# Patient Record
Sex: Female | Born: 1970 | Race: Black or African American | Hispanic: No | Marital: Single | Smoking: Never smoker
Health system: Southern US, Community
[De-identification: ages and names within clinical notes are randomized; demographics above are authoritative.]

## PROBLEM LIST (undated history)

## (undated) HISTORY — PX: ABDOMINAL HYSTERECTOMY: SHX81

---

## 2016-10-28 DIAGNOSIS — K760 Fatty (change of) liver, not elsewhere classified: Secondary | ICD-10-CM | POA: Diagnosis not present

## 2016-10-28 DIAGNOSIS — N200 Calculus of kidney: Secondary | ICD-10-CM | POA: Diagnosis not present

## 2017-01-16 DIAGNOSIS — Z1231 Encounter for screening mammogram for malignant neoplasm of breast: Secondary | ICD-10-CM | POA: Diagnosis not present

## 2017-02-25 DIAGNOSIS — Z1211 Encounter for screening for malignant neoplasm of colon: Secondary | ICD-10-CM | POA: Diagnosis not present

## 2017-02-25 DIAGNOSIS — K5730 Diverticulosis of large intestine without perforation or abscess without bleeding: Secondary | ICD-10-CM | POA: Diagnosis not present

## 2017-02-25 DIAGNOSIS — K6389 Other specified diseases of intestine: Secondary | ICD-10-CM | POA: Diagnosis not present

## 2017-09-25 ENCOUNTER — Encounter (HOSPITAL_BASED_OUTPATIENT_CLINIC_OR_DEPARTMENT_OTHER): Payer: Self-pay

## 2017-09-25 ENCOUNTER — Other Ambulatory Visit: Payer: Self-pay

## 2017-09-25 ENCOUNTER — Emergency Department (HOSPITAL_BASED_OUTPATIENT_CLINIC_OR_DEPARTMENT_OTHER)
Admission: EM | Admit: 2017-09-25 | Discharge: 2017-09-25 | Disposition: A | Discharge status: Home / Self Care | Payer: 59 | Attending: Emergency Medicine | Admitting: Emergency Medicine

## 2017-09-25 ENCOUNTER — Emergency Department (HOSPITAL_BASED_OUTPATIENT_CLINIC_OR_DEPARTMENT_OTHER): Payer: 59

## 2017-09-25 DIAGNOSIS — K429 Umbilical hernia without obstruction or gangrene: Secondary | ICD-10-CM | POA: Diagnosis not present

## 2017-09-25 DIAGNOSIS — R3121 Asymptomatic microscopic hematuria: Secondary | ICD-10-CM | POA: Diagnosis not present

## 2017-09-25 DIAGNOSIS — K760 Fatty (change of) liver, not elsewhere classified: Secondary | ICD-10-CM | POA: Insufficient documentation

## 2017-09-25 DIAGNOSIS — N200 Calculus of kidney: Secondary | ICD-10-CM | POA: Diagnosis not present

## 2017-09-25 DIAGNOSIS — R1010 Upper abdominal pain, unspecified: Secondary | ICD-10-CM | POA: Diagnosis present

## 2017-09-25 LAB — COMPREHENSIVE METABOLIC PANEL
ALK PHOS: 106 U/L (ref 38–126)
ALT: 82 U/L — ABNORMAL HIGH (ref 0–44)
ANION GAP: 11 (ref 5–15)
AST: 147 U/L — ABNORMAL HIGH (ref 15–41)
Albumin: 4 g/dL (ref 3.5–5.0)
BUN: 11 mg/dL (ref 6–20)
CALCIUM: 9.5 mg/dL (ref 8.9–10.3)
CHLORIDE: 102 mmol/L (ref 98–111)
CO2: 26 mmol/L (ref 22–32)
Creatinine, Ser: 0.82 mg/dL (ref 0.44–1.00)
Glucose, Bld: 93 mg/dL (ref 70–99)
Potassium: 3.9 mmol/L (ref 3.5–5.1)
SODIUM: 139 mmol/L (ref 135–145)
Total Bilirubin: 1 mg/dL (ref 0.3–1.2)
Total Protein: 8.5 g/dL — ABNORMAL HIGH (ref 6.5–8.1)

## 2017-09-25 LAB — CBC WITH DIFFERENTIAL/PLATELET
BASOS ABS: 0 10*3/uL (ref 0.0–0.1)
BASOS PCT: 0 %
EOS PCT: 5 %
Eosinophils Absolute: 0.4 10*3/uL (ref 0.0–0.7)
HCT: 43.9 % (ref 36.0–46.0)
Hemoglobin: 14.8 g/dL (ref 12.0–15.0)
LYMPHS PCT: 29 %
Lymphs Abs: 2.5 10*3/uL (ref 0.7–4.0)
MCH: 32.6 pg (ref 26.0–34.0)
MCHC: 33.7 g/dL (ref 30.0–36.0)
MCV: 96.7 fL (ref 78.0–100.0)
MONO ABS: 0.7 10*3/uL (ref 0.1–1.0)
Monocytes Relative: 8 %
Neutro Abs: 5 10*3/uL (ref 1.7–7.7)
Neutrophils Relative %: 58 %
PLATELETS: 278 10*3/uL (ref 150–400)
RBC: 4.54 MIL/uL (ref 3.87–5.11)
RDW: 12.6 % (ref 11.5–15.5)
WBC: 8.7 10*3/uL (ref 4.0–10.5)

## 2017-09-25 LAB — URINALYSIS, ROUTINE W REFLEX MICROSCOPIC
Bilirubin Urine: NEGATIVE
Glucose, UA: NEGATIVE mg/dL
KETONES UR: NEGATIVE mg/dL
LEUKOCYTES UA: NEGATIVE
NITRITE: NEGATIVE
PH: 7 (ref 5.0–8.0)
Protein, ur: NEGATIVE mg/dL
SPECIFIC GRAVITY, URINE: 1.01 (ref 1.005–1.030)

## 2017-09-25 LAB — URINALYSIS, MICROSCOPIC (REFLEX)

## 2017-09-25 LAB — TROPONIN I

## 2017-09-25 LAB — LIPASE, BLOOD: LIPASE: 91 U/L — AB (ref 11–51)

## 2017-09-25 MED ORDER — IOPAMIDOL (ISOVUE-300) INJECTION 61%
100.0000 mL | Freq: Once | INTRAVENOUS | Status: AC | PRN
  Administered 2017-09-25: 100 mL via INTRAVENOUS

## 2017-09-25 MED ORDER — FAMOTIDINE 20 MG PO TABS: 20.0000 mg | ORAL_TABLET | Freq: Every day | ORAL | 0 refills | Status: AC

## 2017-09-25 NOTE — ED Triage Notes (Signed)
C/o "feeling something really hard above my belly button" x 1 month-nausea x 2 months ago-NAD-steady gait

## 2017-09-25 NOTE — ED Provider Notes (Signed)
[facility] EMERGENCY DEPARTMENT Provider Note   CSN: [id number] Arrival date & time: 09/25/17  1430     History   Chief Complaint Chief Complaint  Patient presents with  . Abdominal Pain    HPI Angela Kramer is a 47 y.o. female.  She has no significant medical history other than an abdominal hysterectomy.  She is presenting with 2 months of intermittent nausea associated with some upper abdominal discomfort.  It does not seem to be related to food.  It is crampy in nature under her rib cage and subxiphoid.  It does not cause her to vomit.  Seems to happen once every week or so.  She is also noticed a small mass just above her umbilicus for about 1 month.  She is not sure if it been growing in size but is only noticed it a month ago.  No trauma no fevers no chills no chest pain no shortness of breath no vomiting no diarrhea no urinary symptoms no vaginal bleeding or discharge.  She states she is noticed a little bit of red on the toilet paper when she moves her bowels.  This is infrequent.  The history is provided by the patient.  Abdominal Pain   This is a new problem. Episode onset: 2 months. The problem occurs every several days. The problem has not changed since onset.The pain is associated with an unknown factor. The pain is located in the epigastric region. The quality of the pain is cramping. The pain is moderate. Associated symptoms include nausea. Pertinent negatives include fever, diarrhea, melena, vomiting, dysuria, frequency, hematuria and headaches. Nothing aggravates the symptoms. Nothing relieves the symptoms.    History reviewed. No pertinent past medical history.  There are no active problems to display for this patient.   Past Surgical History:  Procedure Laterality Date  . ABDOMINAL HYSTERECTOMY       OB History   None      Home Medications    Prior to Admission medications   Not on File    Family History No family history on  file.  Social History Social History   Tobacco Use  . Smoking status: Never Smoker  . Smokeless tobacco: Never Used  Substance Use Topics  . Alcohol use: Yes    Comment: daily  . Drug use: Yes    Types: Marijuana     Allergies   Percocet [oxycodone-acetaminophen]   Review of Systems Review of Systems  Constitutional: Negative for fever.  HENT: Negative for sore throat.   Eyes: Negative for visual disturbance.  Respiratory: Negative for shortness of breath.   Cardiovascular: Negative for chest pain.  Gastrointestinal: Positive for abdominal pain and nausea. Negative for diarrhea, melena and vomiting.  Genitourinary: Negative for dysuria, frequency and hematuria.  Musculoskeletal: Negative for back pain.  Skin: Negative for rash.  Neurological: Negative for headaches.     Physical Exam Updated Vital Signs BP (!) 157/107 (BP Location: Left Arm)   Pulse 78   Temp 98.1 F (36.7 C) (Oral)   Resp 16   Ht 5\' 8"  (1.727 m)   Wt 107 kg   SpO2 99%   BMI 35.87 kg/m   Physical Exam  Constitutional: She appears well-developed and well-nourished. No distress.  HENT:  Head: Normocephalic and atraumatic.  Eyes: Conjunctivae are normal.  Neck: Neck supple.  Cardiovascular: Normal rate, regular rhythm and normal heart sounds.  No murmur heard. Pulmonary/Chest: Effort normal and breath sounds normal. No respiratory distress.  Abdominal:  Soft. She exhibits mass (about 2cm mobile midline just above umbilicus). There is no tenderness. There is no rigidity, no rebound and no guarding.  Musculoskeletal: She exhibits no edema, tenderness or deformity.  Neurological: She is alert.  Skin: Skin is warm and dry. Capillary refill takes less than 2 seconds.  Psychiatric: She has a normal mood and affect.  Nursing note and vitals reviewed.    ED Treatments / Results  Labs (all labs ordered are listed, but only abnormal results are displayed) Labs Reviewed  COMPREHENSIVE METABOLIC  PANEL - Abnormal; Notable for the following components:      Result Value   Total Protein 8.5 (*)    AST 147 (*)    ALT 82 (*)    All other components within normal limits  LIPASE, BLOOD - Abnormal; Notable for the following components:   Lipase 91 (*)    All other components within normal limits  URINALYSIS, ROUTINE W REFLEX MICROSCOPIC - Abnormal; Notable for the following components:   Color, Urine AMBER (*)    APPearance CLOUDY (*)    Hgb urine dipstick LARGE (*)    All other components within normal limits  URINALYSIS, MICROSCOPIC (REFLEX) - Abnormal; Notable for the following components:   Bacteria, UA MANY (*)    All other components within normal limits  TROPONIN I  CBC WITH DIFFERENTIAL/PLATELET  HEPATITIS PANEL, ACUTE    EKG None  Radiology Ct Abdomen Pelvis W Contrast  Result Date: 09/25/2017 CLINICAL DATA:  Periumbilical pain and palpable mass. EXAM: CT ABDOMEN AND PELVIS WITH CONTRAST TECHNIQUE: Multidetector CT imaging of the abdomen and pelvis was performed using the standard protocol following bolus administration of intravenous contrast. CONTRAST:  ISOVUE-300 IOPAMIDOL (ISOVUE-300) INJECTION 61% COMPARISON:  None. FINDINGS: Lower chest: No acute abnormality. Hepatobiliary: Diffusely low attenuation of the liver. There are two homogeneously enhancing lesions in the left hepatic lobe measuring up to 1.9 cm. The gallbladder is unremarkable. No biliary dilatation. Pancreas: Unremarkable. No pancreatic ductal dilatation or surrounding inflammatory changes. Spleen: Normal in size without focal abnormality. Adrenals/Urinary Tract: The adrenal glands are unremarkable. Multiple small right renal cysts. Bilateral nonobstructive renal calculi measuring up to 5 mm. No hydronephrosis. The bladder is decompressed. Stomach/Bowel: Stomach is within normal limits. Appendix is mildly dilated, measuring 9 mm, but otherwise normal in appearance with air in the distal appendix and no  surrounding inflammatory changes. Prominent submucosal fat in the cecum. No evidence of bowel wall thickening, distention, or inflammatory changes. Vascular/Lymphatic: No significant vascular findings are present. No enlarged abdominal or pelvic lymph nodes. Reproductive: Status post hysterectomy. No adnexal masses. Other: Small fat containing supraumbilical hernia with a neck measuring 1 cm. Mild stranding within the herniated fat. Tiny fat containing umbilical hernia. No free fluid or pneumoperitoneum. Musculoskeletal: Nonspecific 1.0 cm lucent lesion in the L1 vertebral body. IMPRESSION: 1. Small fat containing supraumbilical hernia with stranding of the herniated fat, likely reflecting strangulation. Correlate for reducibility. 2. Mild dilatation of the appendix, measuring up to 9 mm. Otherwise normal in appearance, making acute appendicitis unlikely. Clinical correlation is suggested. 3. Prominent submucosal fat in the cecum may reflect chronic sequelae of prior infection/inflammation. 4. Hepatic steatosis with two homogeneously enhancing lesions in the left liver measuring up to 1.9 cm. Recommend non-emergent liver protocol MRI with and without contrast for further evaluation. This recommendation follows ACR consensus guidelines: Management of Incidental Liver Lesions on CT: A White Paper of the ACR Incidental Findings Committee. J Am Coll Radiol 2017; 16:1096-0454.  5. Bilateral nonobstructive nephrolithiasis. 6. Nonspecific 1.0 cm lucent lesion in the L1 vertebral body. Recommend further evaluation with non-emergent bone scan or lumbar spine MRI. Electronically Signed   By: Obie Dredge M.D.   On: 09/25/2017 16:28    Procedures Procedures (including critical care time)  Medications Ordered in ED Medications - No data to display   Initial Impression / Assessment and Plan / ED Course  I have reviewed the triage vital signs and the nursing notes.  Pertinent labs & imaging results that were  available during my care of the patient were reviewed by me and considered in my medical decision making (see chart for details).  Clinical Course as of Sep 26 999  Fri Sep 25, 2017  14533 Healthy 47 year old female status post hysterectomy here with a month or 2 of upper abdominal discomfort and nausea along with a new midline abdominal mass.  Differential would include ACS, GERD, cholelithiasis, umbilical hernia.  She is getting EKG lab work and she is put in for a CT abdomen and pelvis.   [MB]  1645 Patient's lab work and CAT scan showed multiple findings.  Her LFTs are slightly up and on CT she had some findings of fatty liver with no obvious gallstones.  I asked her about alcohol intake and she did agree that she is probably using more than she should.  I think this may also account for some of her upper abdominal discomfort as there may be some gastritis or reflux.  She also had a lucency on her spine that need follow-up with her doctor.  She also has some blood in her urine and she is had her uterus removed.  She denies any urinary symptoms I also mentioned that she probably needs to follow this up with her PCP.   [MB]  1646 As far as the hernia when I reexamined her is felt the lump and was able to reduce it with some gentle pressure.  She agrees that she cannot feel it now.  I cautioned her that if this recurs with bowel that gets stuck in that area that she becomes very sick and she would need to be evaluated emergently.   [MB]    Clinical Course User Index [MB] Terrilee Files, MD      Final Clinical Impressions(s) / ED Diagnoses   Final diagnoses:  Umbilical hernia without obstruction or gangrene  Fatty liver  Asymptomatic microscopic hematuria    ED Discharge Orders         Ordered    famotidine (PEPCID) 20 MG tablet  Daily     09/25/17 1650           Terrilee Files, MD 09/26/17 1001

## 2017-09-25 NOTE — ED Notes (Signed)
Patient transported to CT 

## 2017-09-25 NOTE — Discharge Instructions (Addendum)
You are evaluated in the emergency department for a mass in your abdominal wall.  You were found to have a fat-containing umbilical hernia there that was reduced.  You also had multiple other findings on your CAT scan.  These will need to be reviewed with her primary care doctor.  You should limit your alcohol or abstaining and I think this would help a lot for your liver findings.  You also had some blood in your urine and a lesion on your spine that we will need follow-up.  Please return if any concerns.

## 2017-09-26 LAB — HEPATITIS PANEL, ACUTE
HEP A IGM: NEGATIVE
HEP B S AG: NEGATIVE
Hep B C IgM: NEGATIVE

## 2017-10-15 DIAGNOSIS — Z23 Encounter for immunization: Secondary | ICD-10-CM | POA: Diagnosis not present

## 2017-11-09 DIAGNOSIS — R69 Illness, unspecified: Secondary | ICD-10-CM | POA: Diagnosis not present

## 2017-11-09 DIAGNOSIS — R350 Frequency of micturition: Secondary | ICD-10-CM | POA: Diagnosis not present

## 2017-11-09 DIAGNOSIS — E663 Overweight: Secondary | ICD-10-CM | POA: Diagnosis not present

## 2017-12-10 DIAGNOSIS — R69 Illness, unspecified: Secondary | ICD-10-CM | POA: Diagnosis not present

## 2018-01-22 DIAGNOSIS — Z1231 Encounter for screening mammogram for malignant neoplasm of breast: Secondary | ICD-10-CM | POA: Diagnosis not present

## 2018-02-04 DIAGNOSIS — R69 Illness, unspecified: Secondary | ICD-10-CM | POA: Diagnosis not present

## 2018-02-04 DIAGNOSIS — G4730 Sleep apnea, unspecified: Secondary | ICD-10-CM | POA: Diagnosis not present

## 2018-04-26 DIAGNOSIS — L309 Dermatitis, unspecified: Secondary | ICD-10-CM | POA: Diagnosis not present

## 2018-04-26 DIAGNOSIS — J309 Allergic rhinitis, unspecified: Secondary | ICD-10-CM | POA: Diagnosis not present

## 2018-04-26 DIAGNOSIS — L700 Acne vulgaris: Secondary | ICD-10-CM | POA: Diagnosis not present

## 2018-08-06 DIAGNOSIS — R69 Illness, unspecified: Secondary | ICD-10-CM | POA: Diagnosis not present

## 2018-08-06 DIAGNOSIS — K469 Unspecified abdominal hernia without obstruction or gangrene: Secondary | ICD-10-CM | POA: Diagnosis not present

## 2018-08-06 DIAGNOSIS — L709 Acne, unspecified: Secondary | ICD-10-CM | POA: Diagnosis not present

## 2018-08-12 DIAGNOSIS — R69 Illness, unspecified: Secondary | ICD-10-CM | POA: Diagnosis not present

## 2018-11-05 DIAGNOSIS — Z23 Encounter for immunization: Secondary | ICD-10-CM | POA: Diagnosis not present

## 2018-12-18 ENCOUNTER — Other Ambulatory Visit: Payer: Self-pay

## 2018-12-18 ENCOUNTER — Emergency Department (HOSPITAL_BASED_OUTPATIENT_CLINIC_OR_DEPARTMENT_OTHER): Payer: 59

## 2018-12-18 ENCOUNTER — Emergency Department (HOSPITAL_BASED_OUTPATIENT_CLINIC_OR_DEPARTMENT_OTHER)
Admission: EM | Admit: 2018-12-18 | Discharge: 2018-12-19 | Disposition: A | Discharge status: Home / Self Care | Payer: 59 | Attending: Emergency Medicine | Admitting: Emergency Medicine

## 2018-12-18 ENCOUNTER — Encounter (HOSPITAL_BASED_OUTPATIENT_CLINIC_OR_DEPARTMENT_OTHER): Payer: Self-pay | Admitting: Emergency Medicine

## 2018-12-18 DIAGNOSIS — R69 Illness, unspecified: Secondary | ICD-10-CM | POA: Diagnosis not present

## 2018-12-18 DIAGNOSIS — K852 Alcohol induced acute pancreatitis without necrosis or infection: Secondary | ICD-10-CM | POA: Diagnosis not present

## 2018-12-18 DIAGNOSIS — R1013 Epigastric pain: Secondary | ICD-10-CM | POA: Diagnosis not present

## 2018-12-18 DIAGNOSIS — R1011 Right upper quadrant pain: Secondary | ICD-10-CM | POA: Diagnosis not present

## 2018-12-18 DIAGNOSIS — R079 Chest pain, unspecified: Secondary | ICD-10-CM | POA: Diagnosis not present

## 2018-12-18 DIAGNOSIS — K859 Acute pancreatitis without necrosis or infection, unspecified: Secondary | ICD-10-CM | POA: Diagnosis not present

## 2018-12-18 DIAGNOSIS — N200 Calculus of kidney: Secondary | ICD-10-CM | POA: Diagnosis not present

## 2018-12-18 DIAGNOSIS — R109 Unspecified abdominal pain: Secondary | ICD-10-CM | POA: Diagnosis present

## 2018-12-18 LAB — CBC WITH DIFFERENTIAL/PLATELET
Abs Immature Granulocytes: 0.02 10*3/uL (ref 0.00–0.07)
Basophils Absolute: 0 10*3/uL (ref 0.0–0.1)
Basophils Relative: 0 %
Eosinophils Absolute: 0.6 10*3/uL — ABNORMAL HIGH (ref 0.0–0.5)
Eosinophils Relative: 6 %
HCT: 46.6 % — ABNORMAL HIGH (ref 36.0–46.0)
Hemoglobin: 15.7 g/dL — ABNORMAL HIGH (ref 12.0–15.0)
Immature Granulocytes: 0 %
Lymphocytes Relative: 24 %
Lymphs Abs: 2.3 10*3/uL (ref 0.7–4.0)
MCH: 31.7 pg (ref 26.0–34.0)
MCHC: 33.7 g/dL (ref 30.0–36.0)
MCV: 94 fL (ref 80.0–100.0)
Monocytes Absolute: 0.5 10*3/uL (ref 0.1–1.0)
Monocytes Relative: 6 %
Neutro Abs: 6.1 10*3/uL (ref 1.7–7.7)
Neutrophils Relative %: 64 %
Platelets: 253 10*3/uL (ref 150–400)
RBC: 4.96 MIL/uL (ref 3.87–5.11)
RDW: 12.9 % (ref 11.5–15.5)
WBC: 9.6 10*3/uL (ref 4.0–10.5)
nRBC: 0 % (ref 0.0–0.2)

## 2018-12-18 LAB — COMPREHENSIVE METABOLIC PANEL
ALT: 52 U/L — ABNORMAL HIGH (ref 0–44)
AST: 44 U/L — ABNORMAL HIGH (ref 15–41)
Albumin: 4.1 g/dL (ref 3.5–5.0)
Alkaline Phosphatase: 115 U/L (ref 38–126)
Anion gap: 10 (ref 5–15)
BUN: 7 mg/dL (ref 6–20)
CO2: 25 mmol/L (ref 22–32)
Calcium: 9.4 mg/dL (ref 8.9–10.3)
Chloride: 98 mmol/L (ref 98–111)
Creatinine, Ser: 0.78 mg/dL (ref 0.44–1.00)
GFR calc Af Amer: >60 mL/min (ref >60)
GFR calc non Af Amer: >60 mL/min (ref >60)
Glucose, Bld: 104 mg/dL — ABNORMAL HIGH (ref 70–99)
Potassium: 3.4 mmol/L — ABNORMAL LOW (ref 3.5–5.1)
Sodium: 133 mmol/L — ABNORMAL LOW (ref 135–145)
Total Bilirubin: 1 mg/dL (ref 0.3–1.2)
Total Protein: 8.3 g/dL — ABNORMAL HIGH (ref 6.5–8.1)

## 2018-12-18 LAB — URINALYSIS, ROUTINE W REFLEX MICROSCOPIC
Bilirubin Urine: NEGATIVE
Glucose, UA: NEGATIVE mg/dL
Hgb urine dipstick: NEGATIVE
Ketones, ur: NEGATIVE mg/dL
Leukocytes,Ua: NEGATIVE
Nitrite: NEGATIVE
Protein, ur: NEGATIVE mg/dL
Specific Gravity, Urine: 1.015 (ref 1.005–1.030)
pH: 6.5 (ref 5.0–8.0)

## 2018-12-18 LAB — LIPASE, BLOOD: Lipase: 277 U/L — ABNORMAL HIGH (ref 11–51)

## 2018-12-18 LAB — TROPONIN I (HIGH SENSITIVITY): Troponin I (High Sensitivity): 5 ng/L (ref <18)

## 2018-12-18 MED ORDER — HYDROCODONE-ACETAMINOPHEN 5-325 MG PO TABS: 2.0000 | ORAL_TABLET | ORAL | 0 refills | Status: AC | PRN

## 2018-12-18 MED ORDER — ONDANSETRON HCL 4 MG/2ML IJ SOLN
4.0000 mg | Freq: Once | INTRAMUSCULAR | Status: AC
  Administered 2018-12-18: 4 mg via INTRAVENOUS
  Filled 2018-12-18: qty 2

## 2018-12-18 MED ORDER — MORPHINE SULFATE (PF) 4 MG/ML IV SOLN
4.0000 mg | Freq: Once | INTRAVENOUS | Status: AC
  Administered 2018-12-18: 4 mg via INTRAVENOUS
  Filled 2018-12-18: qty 1

## 2018-12-18 MED ORDER — IOHEXOL 300 MG/ML  SOLN
100.0000 mL | Freq: Once | INTRAMUSCULAR | Status: AC | PRN
  Administered 2018-12-18: 100 mL via INTRAVENOUS

## 2018-12-18 MED ORDER — SODIUM CHLORIDE 0.9 % IV BOLUS
1000.0000 mL | Freq: Once | INTRAVENOUS | Status: AC
  Administered 2018-12-18: 1000 mL via INTRAVENOUS

## 2018-12-18 MED ORDER — ONDANSETRON 4 MG PO TBDP: ORAL_TABLET | ORAL | 0 refills | Status: AC

## 2018-12-18 NOTE — ED Notes (Signed)
ED Provider at bedside. 

## 2018-12-18 NOTE — ED Triage Notes (Signed)
Patient states that she has had upper right quadrant pain around to her back x 2 days - reports Nausea

## 2018-12-18 NOTE — ED Provider Notes (Signed)
[facility] EMERGENCY DEPARTMENT Provider Note   CSN: [id number] Arrival date & time: 12/18/18  2125     History   Chief Complaint Chief Complaint  Patient presents with  . Abdominal Pain    HPI Angela Kramer is a 48 y.o. female hx of hysterectomy, presenting with right upper quadrant pain and epigastric pain and back pain.  Symptoms going on for the last 2 days and progressively getting worse .  She states that she is nauseated all the time and started having worsening pain.  She states that every time she eats it gets worse.  She felt like it was gas and took some over-the-counter medicines with no relief .  She also took some omeprazole as well .  Had previous hysterectomy but denies any cholecystectomy      The history is provided by the patient.    History reviewed. No pertinent past medical history.  There are no active problems to display for this patient.   Past Surgical History:  Procedure Laterality Date  . ABDOMINAL HYSTERECTOMY       OB History   No obstetric history on file.      Home Medications    Prior to Admission medications   Medication Sig Start Date End Date Taking? Authorizing Provider  famotidine (PEPCID) 20 MG tablet Take 1 tablet (20 mg total) by mouth daily. 09/25/17   Terrilee Files, MD    Family History History reviewed. No pertinent family history.  Social History Social History   Tobacco Use  . Smoking status: Never Smoker  . Smokeless tobacco: Never Used  Substance Use Topics  . Alcohol use: Yes    Comment: daily  . Drug use: Yes    Types: Marijuana     Allergies   Percocet [oxycodone-acetaminophen]   Review of Systems Review of Systems  Gastrointestinal: Positive for abdominal pain and nausea.  All other systems reviewed and are negative.    Physical Exam Updated Vital Signs BP (!) 160/99 (BP Location: Left Arm)   Pulse 72   Temp (!) 97.1 F (36.2 C) (Oral)   Resp 18   Ht 5\' 8"  (1.727 m)    Wt 104.3 kg   SpO2 100%   BMI 34.97 kg/m   Physical Exam Vitals signs and nursing note reviewed.  Constitutional:      Comments: Uncomfortable   HENT:     Head: Normocephalic.  Eyes:     Extraocular Movements: Extraocular movements intact.  Cardiovascular:     Rate and Rhythm: Normal rate and regular rhythm.     Heart sounds: Normal heart sounds.  Pulmonary:     Effort: Pulmonary effort is normal.     Breath sounds: Normal breath sounds.  Abdominal:     General: Abdomen is flat.     Comments: Mild RUQ tenderness and epigastric tenderness   Skin:    General: Skin is warm.     Capillary Refill: Capillary refill takes less than 2 seconds.  Neurological:     General: No focal deficit present.     Mental Status: She is alert and oriented to person, place, and time.  Psychiatric:        Mood and Affect: Mood normal.        Behavior: Behavior normal.      ED Treatments / Results  Labs (all labs ordered are listed, but only abnormal results are displayed) Labs Reviewed  CBC WITH DIFFERENTIAL/PLATELET - Abnormal; Notable for the following components:  Result Value   Hemoglobin 15.7 (*)    HCT 46.6 (*)    Eosinophils Absolute 0.6 (*)    All other components within normal limits  COMPREHENSIVE METABOLIC PANEL - Abnormal; Notable for the following components:   Sodium 133 (*)    Potassium 3.4 (*)    Glucose, Bld 104 (*)    Total Protein 8.3 (*)    AST 44 (*)    ALT 52 (*)    All other components within normal limits  LIPASE, BLOOD - Abnormal; Notable for the following components:   Lipase 277 (*)    All other components within normal limits  URINALYSIS, ROUTINE W REFLEX MICROSCOPIC  TROPONIN I (HIGH SENSITIVITY)    EKG EKG Interpretation  Date/Time:  Saturday December 18 2018 22:15:55 EST Ventricular Rate:  69 PR Interval:    QRS Duration: 78 QT Interval:  410 QTC Calculation: 440 R Axis:   33 Text Interpretation: Sinus rhythm Minimal ST elevation,  inferior leads poor baseline, grossly unchanged since previous Confirmed by Wandra Arthurs [phone]) on 12/18/2018 10:36:16 PM   Radiology No results found.  Procedures Procedures (including critical care time)   EMERGENCY DEPARTMENT BILIARY ULTRASOUND INTERPRETATION "Study: Limited Abdominal Ultrasound of the Gallbladder and Common Bile Duct."  INDICATIONS: Abdominal pain Indication: Multiple views of the gallbladder and common bile duct were obtained in real-time with a Multi-frequency probe."  PERFORMED BY:  Myself IMAGES ARCHIVED?: Yes LIMITATIONS: Body habitus INTERPRETATION: Normal    Medications Ordered in ED Medications  iohexol (OMNIPAQUE) 300 MG/ML solution 100 mL (has no administration in time range)  sodium chloride 0.9 % bolus 1,000 mL (1,000 mLs Intravenous New Bag/Given 12/18/18 2243)  ondansetron (ZOFRAN) injection 4 mg (4 mg Intravenous Given 12/18/18 2230)  morphine 4 MG/ML injection 4 mg (4 mg Intravenous Given 12/18/18 2248)     Initial Impression / Assessment and Plan / ED Course  I have reviewed the triage vital signs and the nursing notes.  Pertinent labs & imaging results that were available during my care of the patient were reviewed by me and considered in my medical decision making (see chart for details).        Angela Kramer is a 48 y.o. female here presenting with epigastric pain and right upper quadrant pain.  Consider biliary colic versus pancreatitis versus ACS.  Will get labs and LFTs and lipase. Will get CT ab/pel.   11:28 PM Lipase 270.  Patient admits to going to a party and drinking some alcohol 2 days ago.   I think patient likely had alcohol pancreatitis and patient denies chronic alcohol use.  CT ab/pel pending. If CT showed uncomplicated pancreatitis and patient's pain is under control, anticipate dc home. E prescribed vicodin and zofran already. Signed out to Dr. Randal Buba in the ED.    Final Clinical Impressions(s) / ED Diagnoses    Final diagnoses:  None    ED Discharge Orders    None       Drenda Freeze, MD 12/18/18 2329

## 2018-12-18 NOTE — Discharge Instructions (Addendum)
Stay hydrated   Avoid drinking alcohol   Take vicodin for pain. Take zofran for nausea   See your doctor.  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 Please have you doctor order outpatient liver MRI to assess this area seen on CT  Return to ER if you have worse abdominal pain, vomiting, fever

## 2018-12-18 NOTE — ED Notes (Signed)
Pt states she will have driver for discharge.

## 2018-12-18 NOTE — ED Notes (Signed)
Patient transported to CT 

## 2018-12-19 DIAGNOSIS — K852 Alcohol induced acute pancreatitis without necrosis or infection: Secondary | ICD-10-CM | POA: Diagnosis not present

## 2018-12-19 LAB — TROPONIN I (HIGH SENSITIVITY): Troponin I (High Sensitivity): 6 ng/L (ref <18)

## 2019-01-11 DIAGNOSIS — L810 Postinflammatory hyperpigmentation: Secondary | ICD-10-CM | POA: Diagnosis not present

## 2019-01-11 DIAGNOSIS — L700 Acne vulgaris: Secondary | ICD-10-CM | POA: Diagnosis not present

## 2019-02-10 DIAGNOSIS — Z1231 Encounter for screening mammogram for malignant neoplasm of breast: Secondary | ICD-10-CM | POA: Diagnosis not present

## 2019-04-06 DIAGNOSIS — M7581 Other shoulder lesions, right shoulder: Secondary | ICD-10-CM | POA: Diagnosis not present

## 2019-06-07 DIAGNOSIS — L723 Sebaceous cyst: Secondary | ICD-10-CM | POA: Diagnosis not present

## 2020-06-01 IMAGING — CT CT ABD-PELV W/ CM
2 of 5 series · 15 of 46 positions shown, 17 images · IV contrast (Omnipaque)
Comparison: 09/25/2017

CLINICAL DATA: Abdominal distension.

EXAM:
CT ABDOMEN AND PELVIS WITH CONTRAST
TECHNIQUE: Multidetector CT imaging of the abdomen and pelvis was performed
using the standard protocol following bolus administration of
intravenous contrast.
CONTRAST:  100mL OMNIPAQUE IOHEXOL 300 MG/ML  SOLN

[Series 2: axial st · axial · 0.84mm/px · z∈[+657,+1092]mm · 12 of 97 slices shown, 14 images]
[im 5/97  soft-tissue]
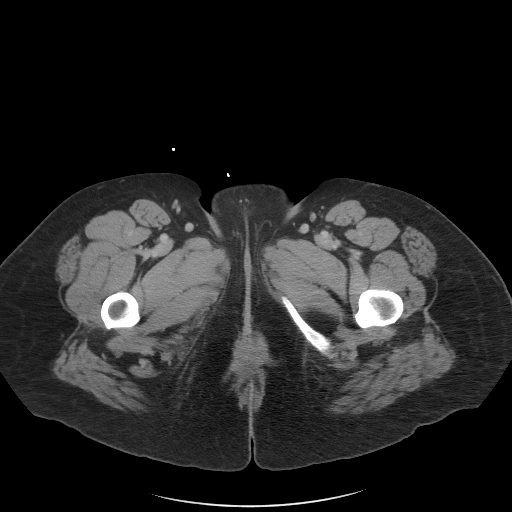
[im 5/97  bone]
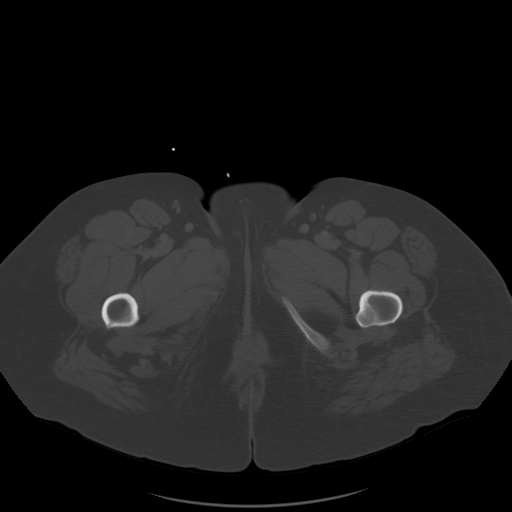
[im 15/97  soft-tissue]
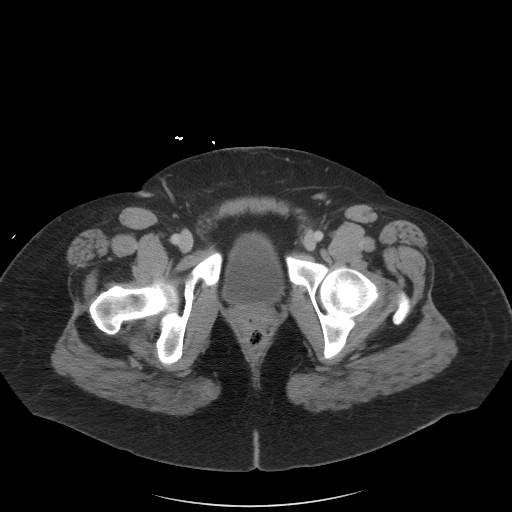
[im 20/97  soft-tissue]
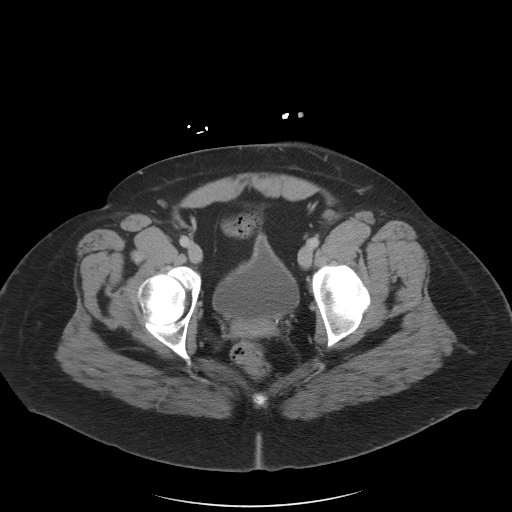
[im 29/97  soft-tissue]
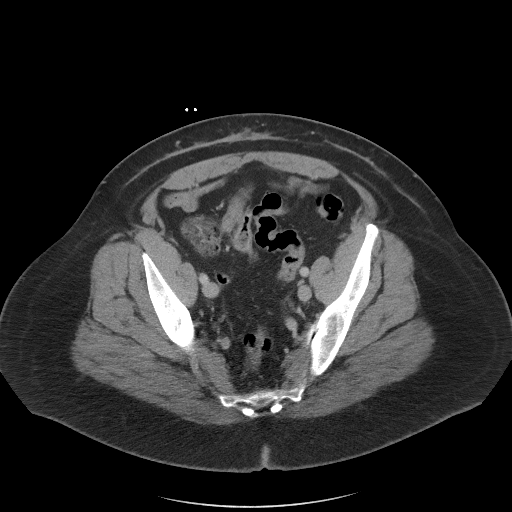
[im 39/97  soft-tissue]
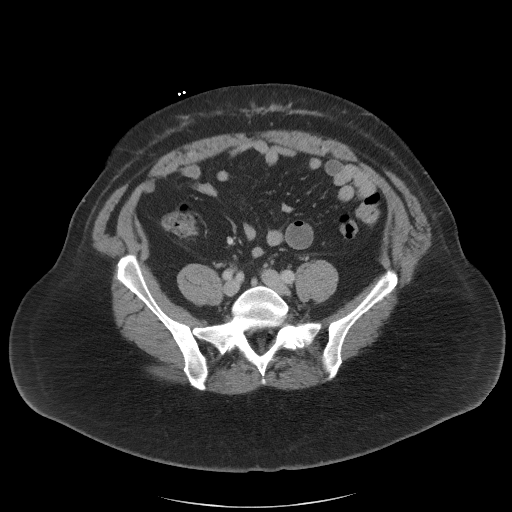
[im 44/97  soft-tissue]
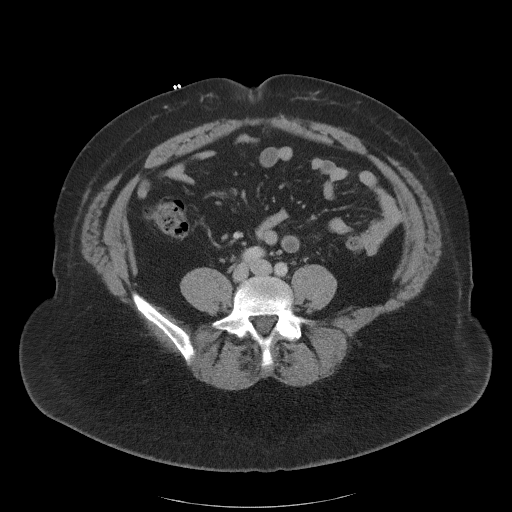
[im 53/97  soft-tissue]
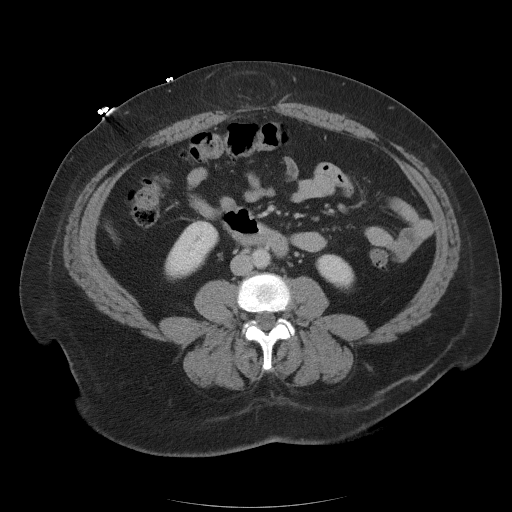
[im 58/97  soft-tissue]
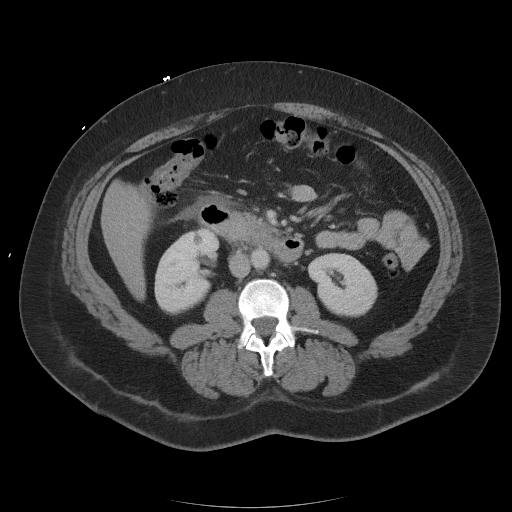
[im 68/97  soft-tissue]
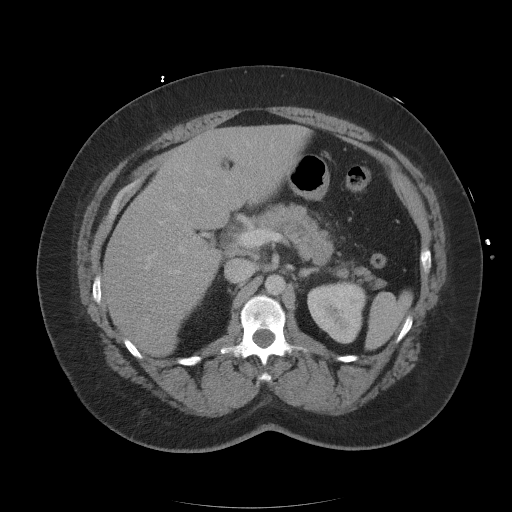
[im 68/97  bone]
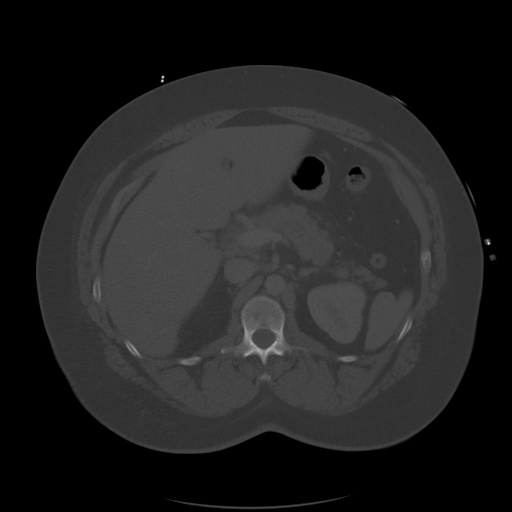
[im 77/97  soft-tissue]
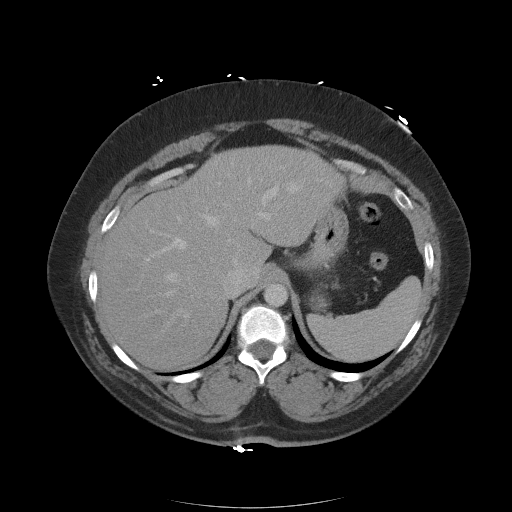
[im 82/97  soft-tissue]
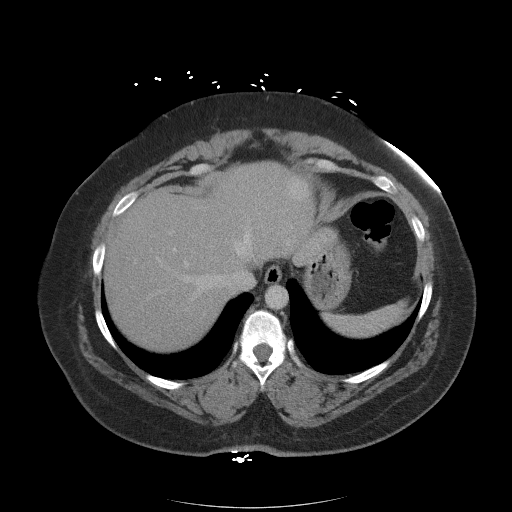
[im 92/97  soft-tissue]
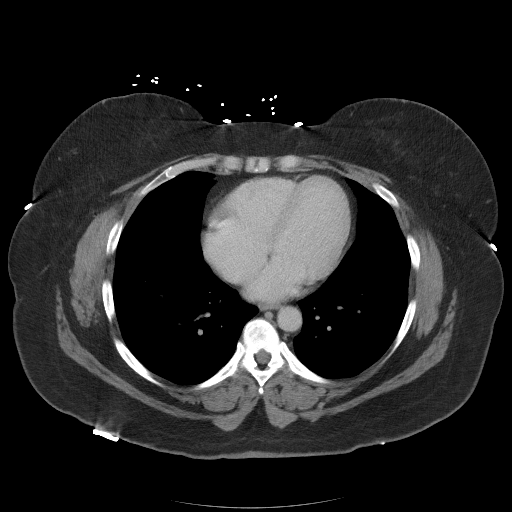

[Series 5: coronal st · coronal · 0.72mm/px · 3 of 104 slices shown]
[im 35/104  soft-tissue]
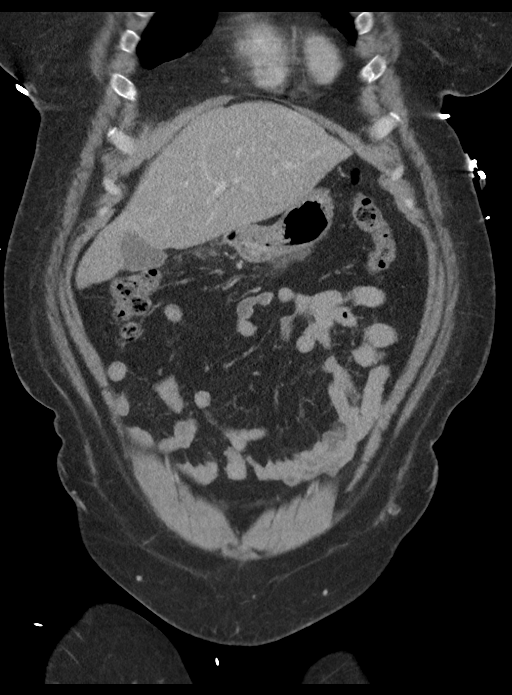
[im 46/104  soft-tissue]
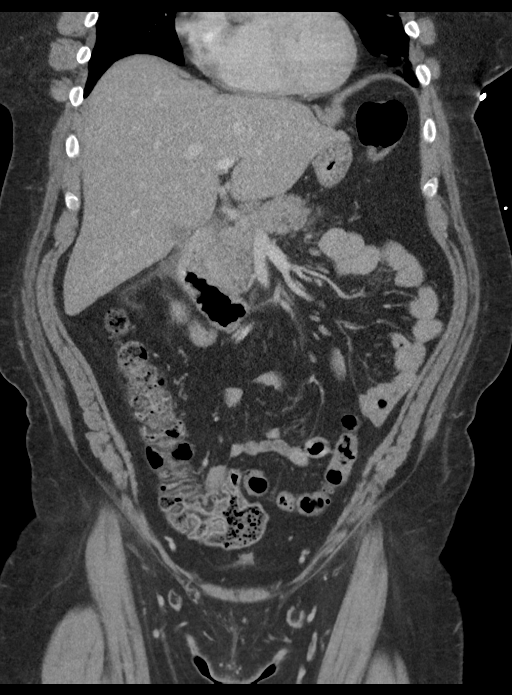
[im 58/104  soft-tissue]
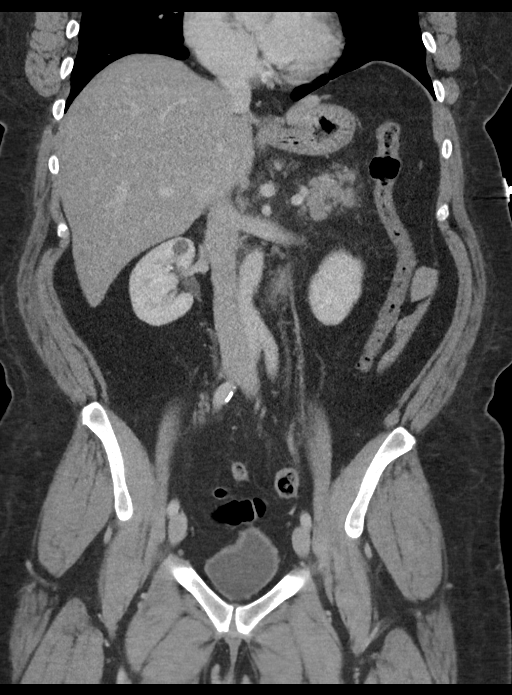

[15 of 46 positions shown; findings below may reference images not displayed]

FINDINGS: Lower chest: The lung bases are clear. The heart size is normal.

Hepatobiliary: There is decreased hepatic attenuation suggestive of
hepatic steatosis. Again noted is a hyperattenuating area in the
left hepatic lobe measuring approximately 2 cm. Normal
gallbladder.There is no biliary ductal dilation.

Pancreas: There is diffuse peripancreatic fat stranding with a small
amount of peripancreatic free fluid. There is a small amount of free
fluid in the retroperitoneal space. The pancreas enhances uniformly.

Spleen: No splenic laceration or hematoma.

Adrenals/Urinary Tract:

--Adrenal glands: No adrenal hemorrhage.

--Right kidney/ureter: There are multiple nonobstructing stones in
the right kidney. There are indeterminate low-attenuation nodules in
the right kidney that are essentially stable from prior study and
favored to represent hemorrhagic or proteinaceous cysts.

--Left kidney/ureter: There are multiple nonobstructing stones in
the left kidney.

--Urinary bladder: Unremarkable.

Stomach/Bowel:

--Stomach/Duodenum: No hiatal hernia or other gastric abnormality.
Normal duodenal course and caliber.

--Small bowel: No dilatation or inflammation.

--Colon: No focal abnormality.

--Appendix: Normal.

Vascular/Lymphatic: Normal course and caliber of the major abdominal
vessels.

--No retroperitoneal lymphadenopathy.

--No mesenteric lymphadenopathy.

--No pelvic or inguinal lymphadenopathy.

Reproductive: Status post hysterectomy. No adnexal mass.

Other: No ascites or free air. There is a fat containing ventral
wall hernia. The hernia sac measures 6 cm.

Musculoskeletal. No acute displaced fractures.
IMPRESSION: 1. Acute pancreatitis without evidence for pancreatic necrosis.
2. Hepatic steatosis.
3. Unchanged hyperattenuating area in the left hepatic lobe, likely
a flash filling hemangioma. Follow-up with a nonemergent outpatient
liver mass protocol MRI is recommended for further evaluation if not
already performed at an outside institution.
4. Fat containing ventral wall hernia.
5. Bilateral nonobstructive nephrolithiasis.

## 2020-06-01 IMAGING — CR DG CHEST 2V
2 series · 2 of 2 positions shown · non-contrast
Comparison: None.

CLINICAL DATA: Chest pain.

EXAM:
CHEST - 2 VIEW

[w chest pa]
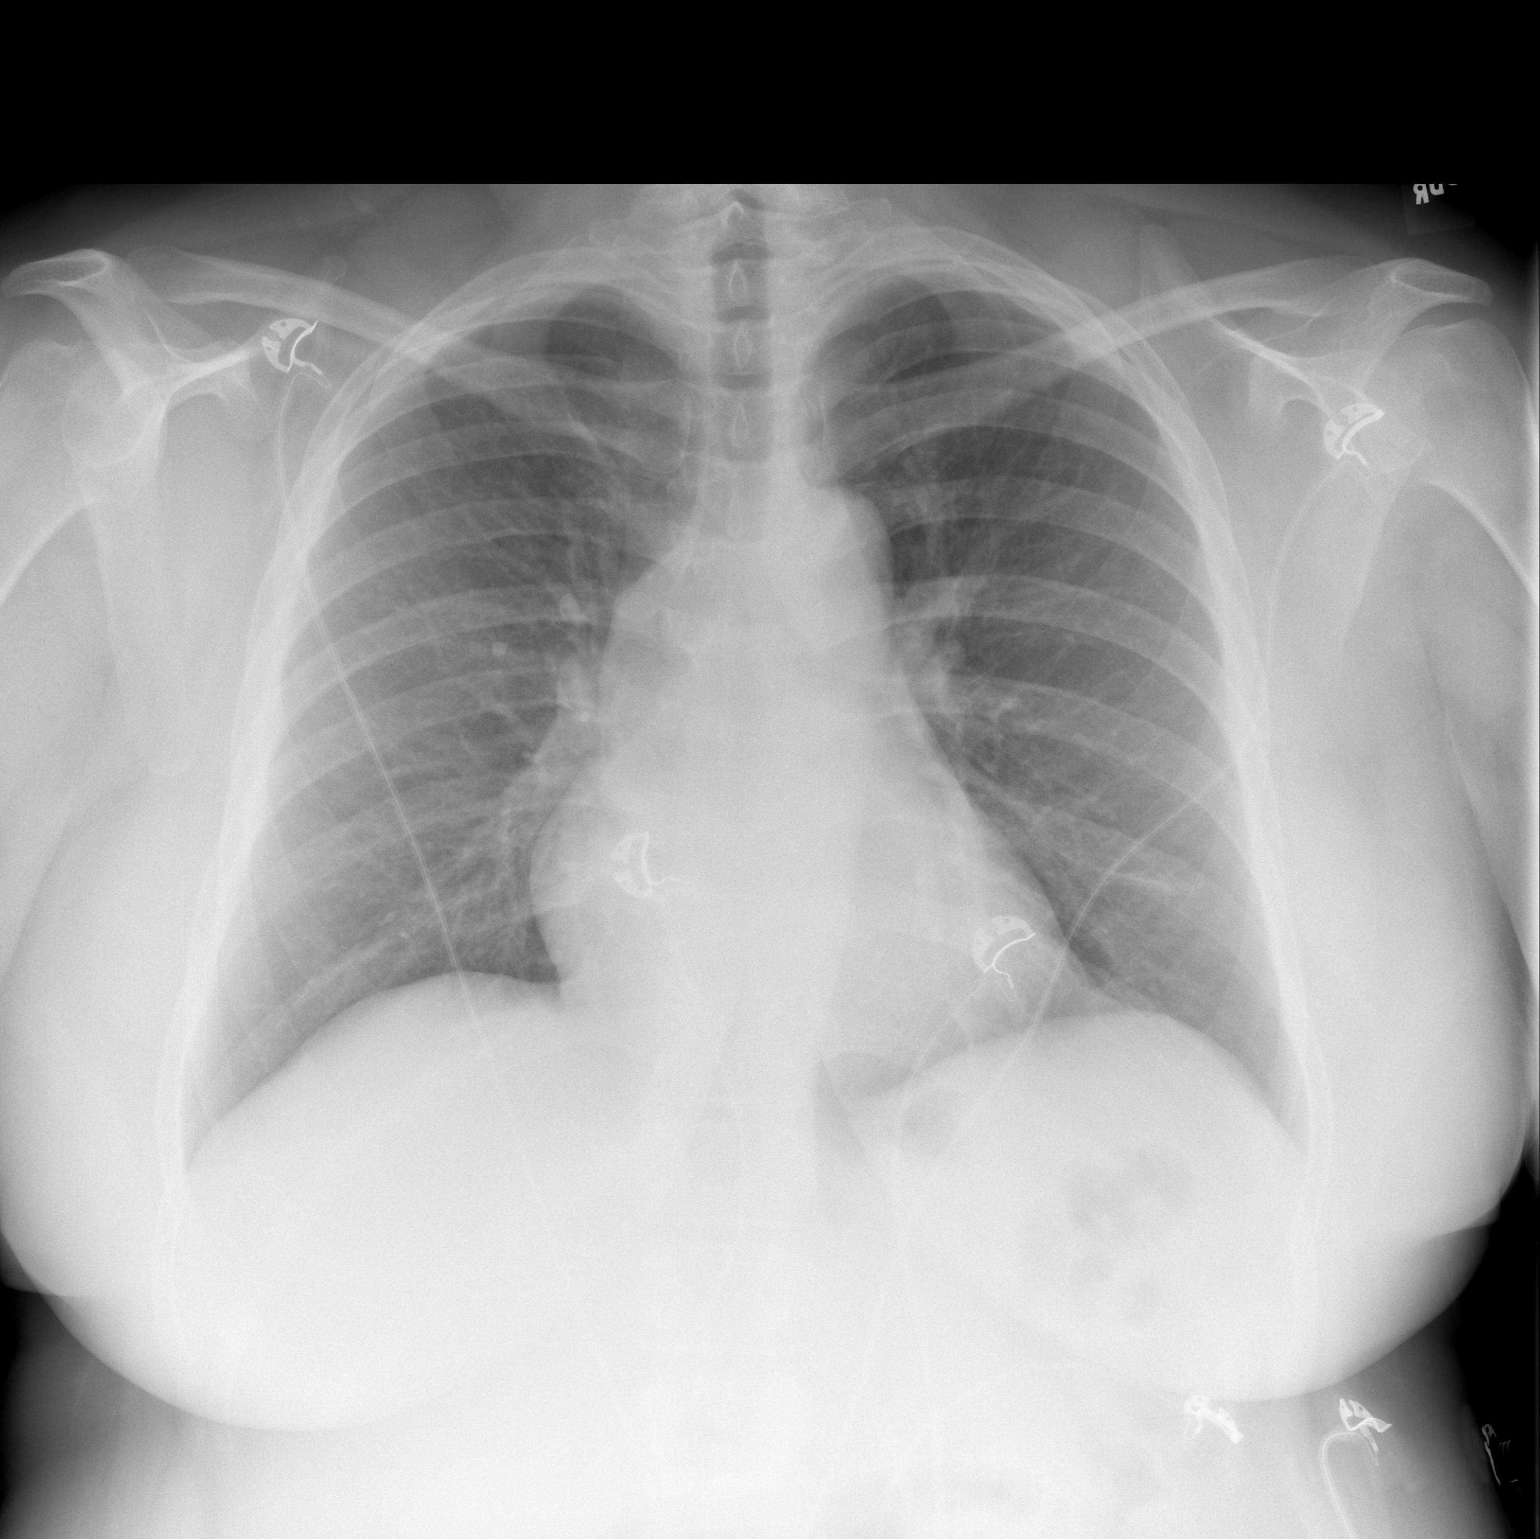

[w chest lat]
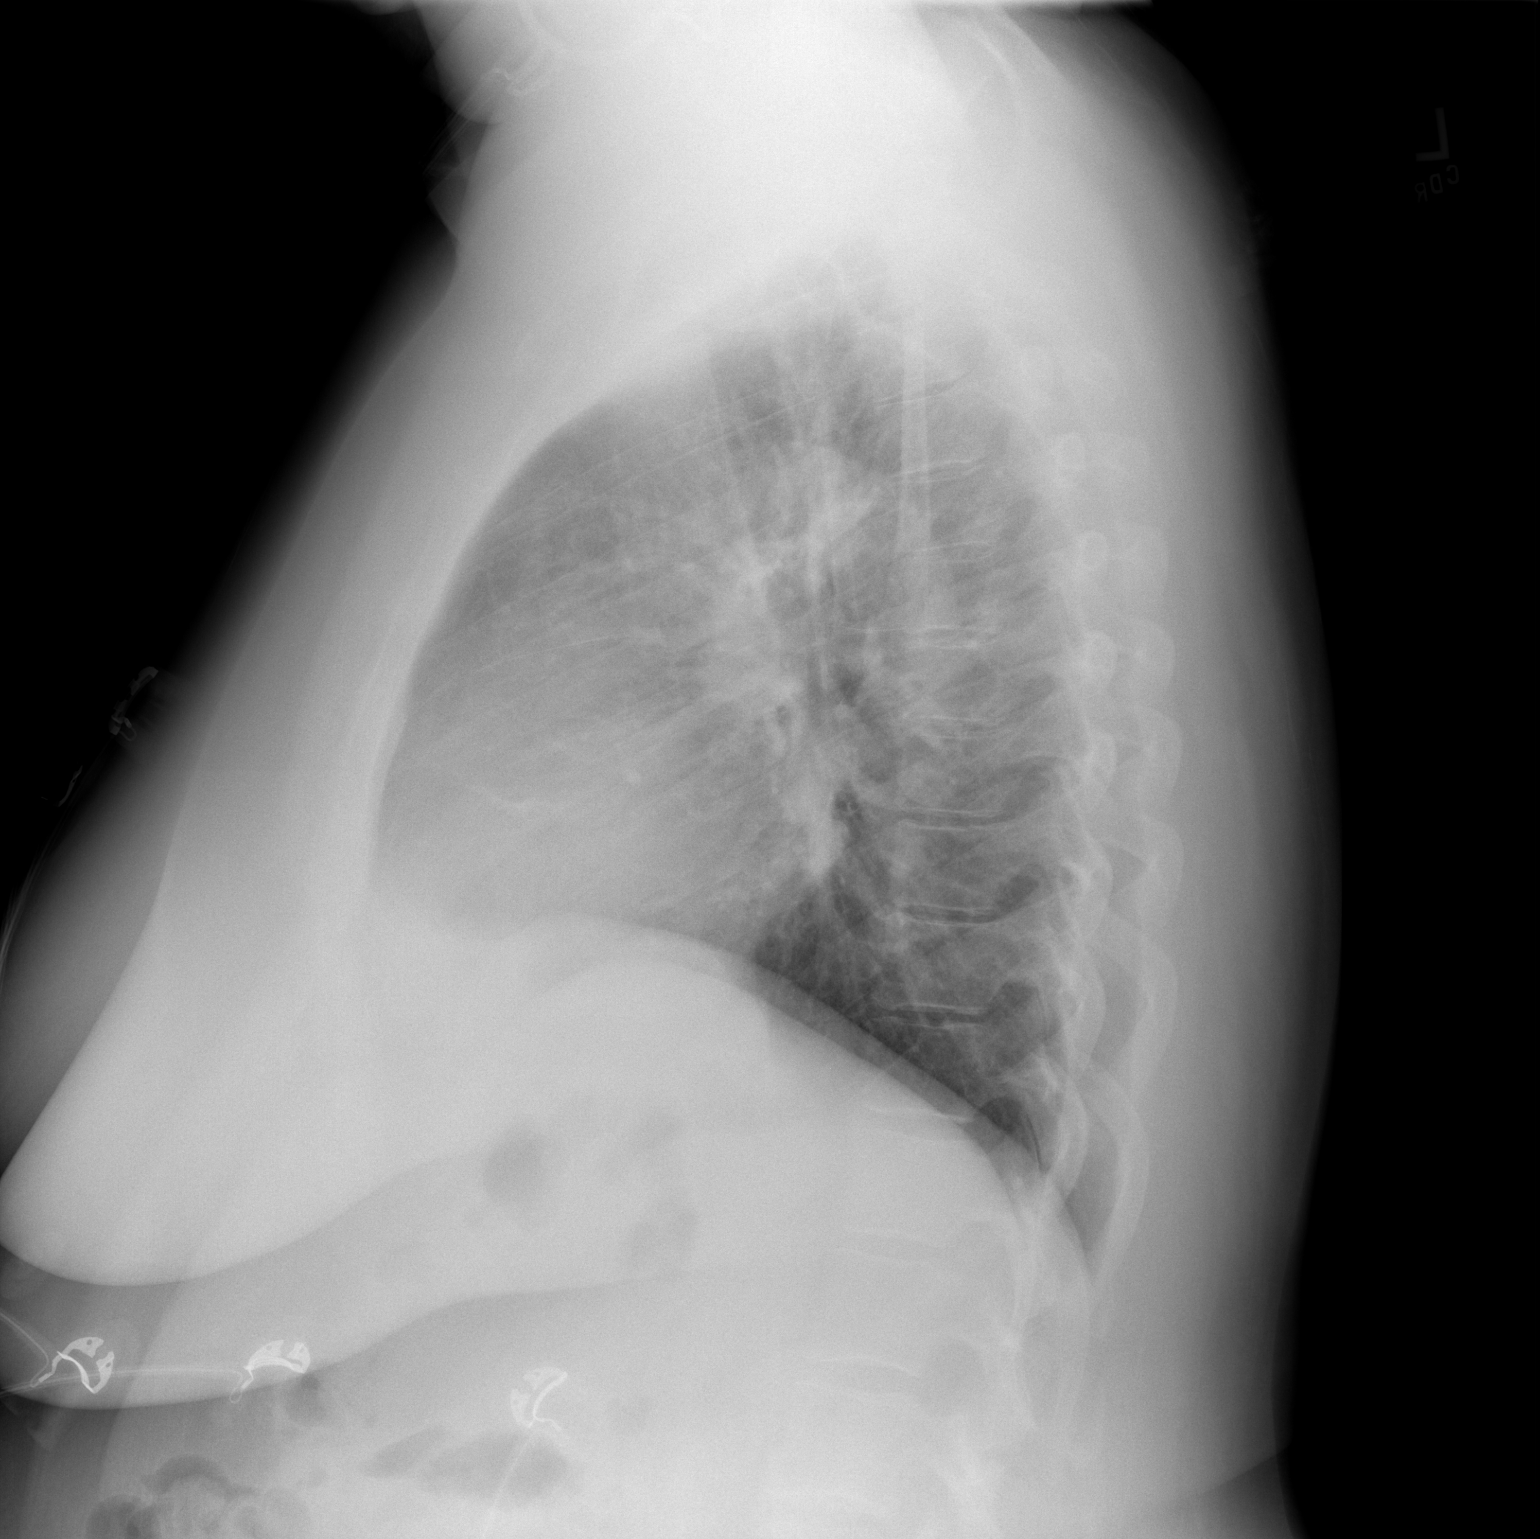

[2 of 2 positions shown; findings below may reference images not displayed]

FINDINGS: The cardiomediastinal contours are normal. Subsegmental atelectasis
in the lingula. Pulmonary vasculature is normal. No consolidation,
pleural effusion, or pneumothorax. No acute osseous abnormalities
are seen.
IMPRESSION: Subsegmental atelectasis in the lingula.  Otherwise negative.
